# Patient Record
Sex: Male | Born: 1995 | Race: White | Hispanic: No | Marital: Married | Smoking: Never smoker
Health system: Southern US, Community
[De-identification: ages and names within clinical notes are randomized; demographics above are authoritative.]

---

## 2018-12-01 ENCOUNTER — Encounter (HOSPITAL_COMMUNITY): Payer: Self-pay

## 2018-12-01 ENCOUNTER — Other Ambulatory Visit: Payer: Self-pay

## 2018-12-01 ENCOUNTER — Other Ambulatory Visit: Payer: Self-pay | Admitting: Behavioral Health

## 2018-12-01 ENCOUNTER — Inpatient Hospital Stay (HOSPITAL_COMMUNITY)
Admission: AD | Admit: 2018-12-01 | Discharge: 2018-12-06 | DRG: 885 | Disposition: A | Discharge status: Home / Self Care | Payer: Federal, State, Local not specified - Other | Source: Other Acute Inpatient Hospital | Attending: Psychiatry | Admitting: Psychiatry

## 2018-12-01 DIAGNOSIS — Z590 Homelessness: Secondary | ICD-10-CM | POA: Diagnosis not present

## 2018-12-01 DIAGNOSIS — F331 Major depressive disorder, recurrent, moderate: Secondary | ICD-10-CM | POA: Diagnosis not present

## 2018-12-01 DIAGNOSIS — F0634 Mood disorder due to known physiological condition with mixed features: Secondary | ICD-10-CM | POA: Diagnosis not present

## 2018-12-01 DIAGNOSIS — F17210 Nicotine dependence, cigarettes, uncomplicated: Secondary | ICD-10-CM | POA: Diagnosis present

## 2018-12-01 DIAGNOSIS — Z915 Personal history of self-harm: Secondary | ICD-10-CM

## 2018-12-01 DIAGNOSIS — F332 Major depressive disorder, recurrent severe without psychotic features: Secondary | ICD-10-CM | POA: Diagnosis not present

## 2018-12-01 DIAGNOSIS — R52 Pain, unspecified: Secondary | ICD-10-CM

## 2018-12-01 DIAGNOSIS — R45851 Suicidal ideations: Secondary | ICD-10-CM | POA: Diagnosis present

## 2018-12-01 DIAGNOSIS — F3189 Other bipolar disorder: Secondary | ICD-10-CM | POA: Diagnosis not present

## 2018-12-01 DIAGNOSIS — F329 Major depressive disorder, single episode, unspecified: Secondary | ICD-10-CM | POA: Diagnosis present

## 2018-12-01 DIAGNOSIS — F3160 Bipolar disorder, current episode mixed, unspecified: Secondary | ICD-10-CM | POA: Diagnosis present

## 2018-12-01 DIAGNOSIS — G4700 Insomnia, unspecified: Secondary | ICD-10-CM | POA: Diagnosis present

## 2018-12-01 MED ORDER — LORAZEPAM 1 MG PO TABS
1.0000 mg | ORAL_TABLET | Freq: Four times a day (QID) | ORAL | Status: DC | PRN
  Administered 2018-12-04: 1 mg via ORAL
  Filled 2018-12-01: qty 1

## 2018-12-01 MED ORDER — OLANZAPINE 10 MG PO TBDP
10.0000 mg | ORAL_TABLET | Freq: Three times a day (TID) | ORAL | Status: DC | PRN
  Administered 2018-12-06: 10 mg via ORAL
  Filled 2018-12-01: qty 1

## 2018-12-01 MED ORDER — LORAZEPAM 1 MG PO TABS: 1.0000 mg | ORAL_TABLET | ORAL | Status: DC | PRN

## 2018-12-01 MED ORDER — ALUM & MAG HYDROXIDE-SIMETH 200-200-20 MG/5ML PO SUSP: 30.0000 mL | ORAL | Status: DC | PRN

## 2018-12-01 MED ORDER — MAGNESIUM HYDROXIDE 400 MG/5ML PO SUSP: 30.0000 mL | Freq: Every day | ORAL | Status: DC | PRN

## 2018-12-01 MED ORDER — ZIPRASIDONE MESYLATE 20 MG IM SOLR: 20.0000 mg | INTRAMUSCULAR | Status: DC | PRN

## 2018-12-01 NOTE — Tx Team (Signed)
Initial Treatment Plan 12/01/2018 4:46 PM Nicholas Nunez HKG:[id number]    PATIENT STRESSORS: Financial difficulties Marital or family conflict Substance abuse Traumatic event   PATIENT STRENGTHS: Communication skills Motivation for treatment/growth   PATIENT IDENTIFIED PROBLEMS: "suicidal thoughts"  "depression"  "steel plate in my jaw"                 DISCHARGE CRITERIA:  Ability to meet basic life and health needs Adequate post-discharge living arrangements Improved stabilization in mood, thinking, and/or behavior Medical problems require only outpatient monitoring Reduction of life-threatening or endangering symptoms to within safe limits  PRELIMINARY DISCHARGE PLAN: Outpatient therapy Placement in alternative living arrangements  PATIENT/FAMILY INVOLVEMENT: This treatment plan has been presented to and reviewed with the patient, Nicholas Nunez.  The patient and family have been given the opportunity to ask questions and make suggestions.  Baron Sane, RN 12/01/2018, 4:46 PM
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
## 2018-12-01 NOTE — BHH Group Notes (Signed)
Adult Psychoeducational Group Note  Date:  12/01/2018 Time:  9:42 PM  Group Topic/Focus:  Wrap-Up Group:   The focus of this group is to help patients review their daily goal of treatment and discuss progress on daily workbooks.  Participation Level:  Active  Participation Quality:  Appropriate and Attentive  Affect:  Appropriate  Cognitive:  Alert and Appropriate  Insight: Appropriate and Good  Engagement in Group:  Engaged  Modes of Intervention:  Discussion and Education  Additional Comments:  Pt attended and participated in wrap up group this evening and rated their day a 7/10. Pt did not achieve their goal, which was to "get better, but they only fell deeper".   NAFTALI CARCHI 12/01/2018, 9:42 PM

## 2018-12-01 NOTE — Progress Notes (Signed)
D: Pt presented very arrogant , irritable, confrontational and tried to be manipulative with Probation officer. Pt stated he was SI/ [facility]- but contracted for safety A: Pt was offered support and encouragement.  Pt was encourage to attend groups. Q 15 minute checks were done for safety.  R: safety maintained on unit.

## 2018-12-02 ENCOUNTER — Inpatient Hospital Stay (HOSPITAL_COMMUNITY)
Admission: AD | Admit: 2018-12-02 | Discharge: 2018-12-02 | Disposition: A | Discharge status: Home / Self Care | Payer: Federal, State, Local not specified - Other | Source: Other Acute Inpatient Hospital | Attending: Psychiatry | Admitting: Psychiatry

## 2018-12-02 DIAGNOSIS — F0634 Mood disorder due to known physiological condition with mixed features: Secondary | ICD-10-CM

## 2018-12-02 LAB — LIPID PANEL
Cholesterol: 162 mg/dL (ref 0–200)
HDL: 44 mg/dL (ref >40)
LDL Cholesterol: 111 mg/dL — ABNORMAL HIGH (ref 0–99)
Total CHOL/HDL Ratio: 3.7 RATIO
Triglycerides: 37 mg/dL (ref <150)
VLDL: 7 mg/dL (ref 0–40)

## 2018-12-02 LAB — HEMOGLOBIN A1C
Hgb A1c MFr Bld: 5.8 % — ABNORMAL HIGH (ref 4.8–5.6)
Mean Plasma Glucose: 119.76 mg/dL

## 2018-12-02 MED ORDER — FLUOXETINE HCL 20 MG/5ML PO SOLN
20.0000 mg | Freq: Every day | ORAL | Status: DC
  Administered 2018-12-02: 4 mg via ORAL
  Administered 2018-12-03 – 2018-12-06 (×4): 20 mg via ORAL
  Filled 2018-12-02 (×9): qty 5

## 2018-12-02 MED ORDER — NICOTINE POLACRILEX 2 MG MT GUM
2.0000 mg | CHEWING_GUM | OROMUCOSAL | Status: DC | PRN
  Administered 2018-12-02 – 2018-12-06 (×12): 2 mg via ORAL
  Filled 2018-12-02 (×2): qty 1
  Filled 2018-12-02 (×2): qty 10

## 2018-12-02 MED ORDER — TRAZODONE HCL 100 MG PO TABS
100.0000 mg | ORAL_TABLET | Freq: Every evening | ORAL | Status: DC | PRN
  Filled 2018-12-02: qty 1

## 2018-12-02 MED ORDER — CARBAMAZEPINE 100 MG PO CHEW
200.0000 mg | CHEWABLE_TABLET | Freq: Three times a day (TID) | ORAL | Status: DC
  Administered 2018-12-02 – 2018-12-03 (×3): 200 mg via ORAL
  Filled 2018-12-02 (×6): qty 2

## 2018-12-02 NOTE — Tx Team (Signed)
Interdisciplinary Treatment and Diagnostic Plan Update  12/02/2018 Time of Session: 9:40am Nicholas Nunez MRN: [id number]  Principal Diagnosis: <principal problem not specified>  Secondary Diagnoses: Active Problems:   MDD (major depressive disorder)   Current Medications:  Current Facility-Administered Medications  Medication Dose Route Frequency Provider Last Rate Last Dose  . alum & mag hydroxide-simeth (MAALOX/MYLANTA) 200-200-20 MG/5ML suspension 30 mL  30 mL Oral Q4H PRN Mordecai Maes, NP      . OLANZapine zydis (ZYPREXA) disintegrating tablet 10 mg  10 mg Oral Q8H PRN Cobos, Myer Peer, MD       And  . LORazepam (ATIVAN) tablet 1 mg  1 mg Oral PRN Cobos, Myer Peer, MD       And  . ziprasidone (GEODON) injection 20 mg  20 mg Intramuscular PRN Cobos, Myer Peer, MD      . LORazepam (ATIVAN) tablet 1 mg  1 mg Oral Q6H PRN Cobos, Myer Peer, MD      . magnesium hydroxide (MILK OF MAGNESIA) suspension 30 mL  30 mL Oral Daily PRN Mordecai Maes, NP      . nicotine polacrilex (NICORETTE) gum 2 mg  2 mg Oral PRN Cobos, Myer Peer, MD       PTA Medications: No medications prior to admission.    Patient Stressors: Financial difficulties Marital or family conflict Substance abuse Traumatic event  Patient Strengths: Agricultural engineer for treatment/growth  Treatment Modalities: Medication Management, Group therapy, Case management,  1 to 1 session with clinician, Psychoeducation, Recreational therapy.   Physician Treatment Plan for Primary Diagnosis: <principal problem not specified> Long Term Goal(s): Improvement in symptoms so as ready for discharge Improvement in symptoms so as ready for discharge   Short Term Goals: Ability to identify changes in lifestyle to reduce recurrence of condition will improve Ability to verbalize feelings will improve Ability to disclose and discuss suicidal ideas Ability to demonstrate self-control will improve Ability to  identify and develop effective coping behaviors will improve Ability to maintain clinical measurements within normal limits will improve Ability to identify changes in lifestyle to reduce recurrence of condition will improve Ability to identify triggers associated with substance abuse/mental health issues will improve  Medication Management: Evaluate patient's response, side effects, and tolerance of medication regimen.  Therapeutic Interventions: 1 to 1 sessions, Unit Group sessions and Medication administration.  Evaluation of Outcomes: Not Met  Physician Treatment Plan for Secondary Diagnosis: Active Problems:   MDD (major depressive disorder)  Long Term Goal(s): Improvement in symptoms so as ready for discharge Improvement in symptoms so as ready for discharge   Short Term Goals: Ability to identify changes in lifestyle to reduce recurrence of condition will improve Ability to verbalize feelings will improve Ability to disclose and discuss suicidal ideas Ability to demonstrate self-control will improve Ability to identify and develop effective coping behaviors will improve Ability to maintain clinical measurements within normal limits will improve Ability to identify changes in lifestyle to reduce recurrence of condition will improve Ability to identify triggers associated with substance abuse/mental health issues will improve     Medication Management: Evaluate patient's response, side effects, and tolerance of medication regimen.  Therapeutic Interventions: 1 to 1 sessions, Unit Group sessions and Medication administration.  Evaluation of Outcomes: Not Met   RN Treatment Plan for Primary Diagnosis: <principal problem not specified> Long Term Goal(s): Knowledge of disease and therapeutic regimen to maintain health will improve  Short Term Goals: Ability to participate in decision making will improve, Ability to  verbalize feelings will improve, Ability to disclose and discuss  suicidal ideas, Ability to identify and develop effective coping behaviors will improve and Compliance with prescribed medications will improve  Medication Management: RN will administer medications as ordered by provider, will assess and evaluate patient's response and provide education to patient for prescribed medication. RN will report any adverse and/or side effects to prescribing provider.  Therapeutic Interventions: 1 on 1 counseling sessions, Psychoeducation, Medication administration, Evaluate responses to treatment, Monitor vital signs and CBGs as ordered, Perform/monitor CIWA, COWS, AIMS and Fall Risk screenings as ordered, Perform wound care treatments as ordered.  Evaluation of Outcomes: Not Met   LCSW Treatment Plan for Primary Diagnosis: <principal problem not specified> Long Term Goal(s): Safe transition to appropriate next level of care at discharge, Engage patient in therapeutic group addressing interpersonal concerns.  Short Term Goals: Engage patient in aftercare planning with referrals and resources  Therapeutic Interventions: Assess for all discharge needs, 1 to 1 time with Social worker, Explore available resources and support systems, Assess for adequacy in community support network, Educate family and significant other(s) on suicide prevention, Complete Psychosocial Assessment, Interpersonal group therapy.  Evaluation of Outcomes: Not Met   Progress in Treatment: Attending groups: Yes. Participating in groups: Yes. Taking medication as prescribed: Yes. Toleration medication: Yes. Family/Significant other contact made: No, will contact:  if patient consents to collateral contacts Patient understands diagnosis: Yes. Discussing patient identified problems/goals with staff: Yes. Medical problems stabilized or resolved: Yes. Denies suicidal/homicidal ideation: Yes. Issues/concerns per patient self-inventory: No. Other:   New problem(s) identified:  None   New  Short Term/Long Term Goal(s): Detox, medication stabilization, elimination of SI thoughts, development of comprehensive mental wellness plan.    Patient Goals:  "Dont want to be so depressed"   Discharge Plan or Barriers: Patient recently admitted. CSW will continue to follow and assess for appropriate referrals and possible discharge planning.    Reason for Continuation of Hospitalization: Anxiety Depression Medication stabilization Suicidal ideation  Estimated Length of Stay:  Attendees: Patient: Nicholas Nunez 12/02/2018 10:27 AM  Physician: Dr. Myles Lipps, MD 12/02/2018 10:27 AM  Nursing: Selinda Eon.Jerilynn Mages, RN 12/02/2018 10:27 AM  RN Care Manager: 12/02/2018 10:27 AM  Social Worker: Radonna Ricker, [place] 12/02/2018 10:27 AM  Recreational Therapist:  12/02/2018 10:27 AM  Other:  12/02/2018 10:27 AM  Other:  12/02/2018 10:27 AM  Other: 12/02/2018 10:27 AM    Scribe for Treatment Team: Marylee Floras, [place] 12/02/2018 10:27 AM
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
## 2018-12-02 NOTE — Progress Notes (Signed)
D: Pt has remained in his room so far this evening.  Presents with depressed affect and mood.  Denies SI/HI and hallucinations.  Pt is interacting minimally with staff.  A: Met with pt 1:1.  Actively listened to pt and provided support and encouragement.  15 minute safety checks in place.  R: Pt is safe on the unit.  He reports he will inform staff of needs and concerns.  Will continue to monitor and assess.
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
## 2018-12-02 NOTE — Progress Notes (Signed)
Recreation Therapy Notes  Date:  9.4.20 Time: 0930 Location: [address] Dayroom  Group Topic: Stress Management  Goal Area(s) Addresses:  Patient will identify positive stress management techniques. Patient will identify benefits of using stress management post d/c.  Behavioral Response: Engaged  Intervention: Stress Management  Activity : Meditation.  LRT played a meditation that focused on the power of being resilient in the face of adversity.  Patients were to listen as meditation played to follow along.  Education:  Stress Management, Discharge Planning.   Education Outcome: Acknowledges Education  Clinical Observations/Feedback: Pt attended and participated in group.     Victorino Sparrow, LRT/CTRS        Victorino Sparrow A 12/02/2018 10:48 AM

## 2018-12-03 DIAGNOSIS — F331 Major depressive disorder, recurrent, moderate: Secondary | ICD-10-CM

## 2018-12-03 MED ORDER — RISPERIDONE 1 MG PO TABS
1.0000 mg | ORAL_TABLET | Freq: Two times a day (BID) | ORAL | Status: DC
  Administered 2018-12-03 – 2018-12-06 (×7): 1 mg via ORAL
  Filled 2018-12-03 (×6): qty 1
  Filled 2018-12-03: qty 14
  Filled 2018-12-03: qty 1
  Filled 2018-12-03 (×2): qty 14
  Filled 2018-12-03: qty 1
  Filled 2018-12-03: qty 14
  Filled 2018-12-03: qty 1

## 2018-12-03 MED ORDER — CARBAMAZEPINE 100 MG PO CHEW
300.0000 mg | CHEWABLE_TABLET | Freq: Three times a day (TID) | ORAL | Status: DC
  Administered 2018-12-03 – 2018-12-04 (×4): 300 mg via ORAL
  Filled 2018-12-03 (×11): qty 3

## 2018-12-03 NOTE — Progress Notes (Signed)
Patient ID: Nicholas Nunez, male   DOB: 02-25-1996, 23 y.o.   MRN: [id number]  [place] NOVEL CORONAVIRUS (COVID-19) DAILY CHECK-OFF SYMPTOMS - answer yes or no to each - every day NO YES  Have you had a fever in the past 24 hours?  . Fever (Temp > 37.80C / 100F) X   Have you had any of these symptoms in the past 24 hours? . New Cough .  Sore Throat  .  Shortness of Breath .  Difficulty Breathing .  Unexplained Body Aches   X   Have you had any one of these symptoms in the past 24 hours not related to allergies?   . Runny Nose .  Nasal Congestion .  Sneezing   X   If you have had runny nose, nasal congestion, sneezing in the past 24 hours, has it worsened?  X   EXPOSURES - check yes or no X   Have you traveled outside the state in the past 14 days?  X   Have you been in contact with someone with a confirmed diagnosis of COVID-19 or PUI in the past 14 days without wearing appropriate PPE?  X   Have you been living in the same home as a person with confirmed diagnosis of COVID-19 or a PUI (household contact)?    X   Have you been diagnosed with COVID-19?    X              What to do next: Answered NO to all: Answered YES to anything:   Proceed with unit schedule Follow the [facility] Inpatient Flowsheet.

## 2018-12-03 NOTE — Progress Notes (Signed)
Psychoeducational Group Note  Date:  12/03/2018 Time:  2130 Group Topic/Focus:  wrap up group  Participation Level: Did Not Attend  Participation Quality:  Not Applicable  Affect:  Not Applicable  Cognitive:  Not Applicable  Insight:  Not Applicable  Engagement in Group: Not Applicable  Additional Comments:  Pt remained in bed during group time.   Winfield Rast S 12/03/2018, 10:59 PM
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
## 2018-12-03 NOTE — Plan of Care (Signed)
  Problem: Education: Goal: Verbalization of understanding the information provided will improve Outcome: Progressing   Problem: Coping: Goal: Ability to demonstrate self-control will improve Outcome: Progressing   Problem: Safety: Goal: Periods of time without injury will increase Outcome: Progressing   

## 2018-12-03 NOTE — [facility] Suicide Risk Assessment (Signed)
[facility] INPATIENT:  Family/Significant Other Suicide Prevention Education  Suicide Prevention Education:  Patient Refusal for Family/Significant Other Suicide Prevention Education: The patient Nicholas Nunez has refused to provide written consent for family/significant other to be provided Family/Significant Other Suicide Prevention Education during admission and/or prior to discharge.  Physician notified.  SPE completed with pt, as pt refused to consent to family contact. SPI pamphlet provided to pt and pt was encouraged to share information with support network, ask questions, and talk about any concerns relating to SPE. Pt denies access to guns/firearms and verbalized understanding of information provided. Mobile Crisis information also provided to pt.    Avelina Laine LCSW 12/03/2018, 12:46 PM
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
## 2018-12-04 LAB — HEPATIC FUNCTION PANEL
ALT: 14 U/L (ref 0–44)
AST: 16 U/L (ref 15–41)
Albumin: 4.9 g/dL (ref 3.5–5.0)
Alkaline Phosphatase: 70 U/L (ref 38–126)
Bilirubin, Direct: 0.1 mg/dL (ref 0.0–0.2)
Indirect Bilirubin: 0.3 mg/dL (ref 0.3–0.9)
Total Bilirubin: 0.4 mg/dL (ref 0.3–1.2)
Total Protein: 8.6 g/dL — ABNORMAL HIGH (ref 6.5–8.1)

## 2018-12-04 LAB — CBC WITH DIFFERENTIAL/PLATELET
Abs Immature Granulocytes: 0.02 10*3/uL (ref 0.00–0.07)
Basophils Absolute: 0 10*3/uL (ref 0.0–0.1)
Basophils Relative: 1 %
Eosinophils Absolute: 0.2 10*3/uL (ref 0.0–0.5)
Eosinophils Relative: 2 %
HCT: 41.8 % (ref 39.0–52.0)
Hemoglobin: 13.6 g/dL (ref 13.0–17.0)
Immature Granulocytes: 0 %
Lymphocytes Relative: 28 %
Lymphs Abs: 2.3 10*3/uL (ref 0.7–4.0)
MCH: 30.7 pg (ref 26.0–34.0)
MCHC: 32.5 g/dL (ref 30.0–36.0)
MCV: 94.4 fL (ref 80.0–100.0)
Monocytes Absolute: 0.5 10*3/uL (ref 0.1–1.0)
Monocytes Relative: 6 %
Neutro Abs: 5.2 10*3/uL (ref 1.7–7.7)
Neutrophils Relative %: 63 %
Platelets: 195 10*3/uL (ref 150–400)
RBC: 4.43 MIL/uL (ref 4.22–5.81)
RDW: 12.6 % (ref 11.5–15.5)
WBC: 8.2 10*3/uL (ref 4.0–10.5)
nRBC: 0 % (ref 0.0–0.2)

## 2018-12-04 MED ORDER — CARBAMAZEPINE 100 MG PO CHEW
300.0000 mg | CHEWABLE_TABLET | Freq: Two times a day (BID) | ORAL | Status: DC
  Filled 2018-12-04: qty 3

## 2018-12-04 NOTE — Progress Notes (Signed)
Adult Psychoeducational Group Note  Date:  12/04/2018 Time:  9:18 AM  Group Topic/Focus:  Goals Group:   The focus of this group is to help patients establish daily goals to achieve during treatment and discuss how the patient can incorporate goal setting into their daily lives to aide in recovery.  Participation Level:  Active  Participation Quality:  Appropriate, Attentive and Sharing  Affect:  Appropriate  Cognitive:  Alert and Appropriate  Insight: Improving  Engagement in Group:  Engaged  Modes of Intervention:  Discussion, Exploration and Problem-solving  Additional Comments:  Nicholas Nunez's treatment goal is to focus on rebuilding his support system by exchanging  Contact information with a fellow peer. [name] R Sherylann Vangorden 12/04/2018, 9:18 AM

## 2018-12-04 NOTE — [facility] Group Notes (Signed)
[facility] LCSW Group Therapy Note   Date and Time: 12/04/2018 @ 10am  Type of Therapy and Topic:  Group Therapy:  Trust and Honesty    Participation Level:  [facility] PARTICIPATION LEVEL: Active   Mood: Pleasant    Description of Group:     In this group patients will be asked to explore the value of being honest.  Patients will be guided to discuss their thoughts, feelings, and behaviors related to honesty and trusting in others. Patients will process together how trust and honesty relate to forming relationships with peers, family members, and self. Each patient will be challenged to identify and express feelings of being vulnerable. Patients will discuss reasons why people are dishonest and identify alternative outcomes if one was truthful (to self or others). This group will be process-oriented, with patients participating in exploration of their own experiences, giving and receiving support, and processing challenge from other group members.     Therapeutic Goals:  1.  Patient will identify why honesty is important to relationships and how honesty overall affects relationships.  2.  Patient will identify a situation where they lied or were lied too and the  feelings, thought process, and behaviors surrounding the situation  3.  Patient will identify the meaning of being vulnerable, how that feels, and how that correlates to being honest with self and others.  4.  Patient will identify situations where they could have told the truth, but instead lied and explain reasons of dishonesty.     Summary of Patient Progress:      Patient was active and engaged throughout group therapy. Patient was able to identify why honesty is important to relationships. Patient stated that loyalty is why honesty is so important. Patient stated that he feels hurt when he is lied to but was able to identify reasons and his thought process around why he will lie to people. Patient discussed a situation where he  was currently trying to be honest with the doctor and he felt he was not hurt. Patient stated that he feels good when he tells the truth if people listen.        Therapeutic Modalities:    Cognitive Behavioral Therapy  Solution Focused Therapy  Motivational Interviewing  Brief Therapy   Ardelle Anton, MSW, LCSW

## 2018-12-04 NOTE — Progress Notes (Signed)
Writer observed patient up in the dayroom watching tv. Writer spoke with him 1:1 and he reports that the medicine he has taken today that he chews has him feeling like he has a buzz. He reports feeling like he's moving in slow motion. He reports that he does not take trazodone for sleep aid and he will be fine not taking it. Support given and safety maintained on unit with 15 min checks.

## 2018-12-04 NOTE — Progress Notes (Signed)
D.  Pt up and visible within milieu, appears guarded on approach.  It had been reported that patient was experiencing some jaw pain but this was denied when Pt asked.  Pt denies SI/HI/AVH at this time.  He was positive for evening wrap up group.  Pt states that he does not want to take ordered Trazodone for sleep because he had overdosed on it.   A.  Support and encouragement offered, medication given as ordered  R.  Pt remains safe on the unit, will continue to monitor.

## 2018-12-04 NOTE — Progress Notes (Signed)
[facility] Group Notes:  (Nursing/MHT/Case Management/Adjunct)  Date:  12/04/2018  Time:  2030  Type of Therapy:  wrap up group  Participation Level:  Active  Participation Quality:  Appropriate, Attentive, Sharing and Supportive  Affect:  Appropriate  Cognitive:  Lacking  Insight:  Improving  Engagement in Group:  Engaged  Modes of Intervention:  Clarification, Education and Support  Summary of Progress/Problems:  [facility] 12/04/2018, 9:40 PM
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
## 2018-12-04 NOTE — Progress Notes (Addendum)
[facility] MD Progress Note  12/04/2018 2:22 PM [facility]  MRN:  [id number] Subjective:  "These medications have me messed up."  Patient is a 23 year old male with an unspecified past psychiatric history who [facility] to [facility] on 11/30/2018 with suicidal ideation. The patient apparently contacted his significant other to contact police, and then was transported to the [facility]. He was transferred to our facility for evaluation and stabilization.  [facility] found sitting in the dayroom. He is less irritable and loud today. He is less focused on problems with his jaw. Today he is focused on problems with his medication- "these medications are worse than my jaw." He reports that "the chewable pill" is making his ears ring, throwing his legs off balance and making it difficult to think. Patient observed ambulating in the hallway without difficulty. He is still bizarre at times and reports "This medication you gave me is making me see bugs everywhere." He continues to ask about resources for follow-up for his jaw and is more open to hearing options at this time. He has been participating in groups. No agitated or disruptive behaviors on the unit. He denies SI/HI.  Principal Problem: <principal problem not specified> Diagnosis: Active Problems:   MDD (major depressive disorder)  Total Time spent with patient: 15 minutes  Past Psychiatric History: See admission H&P  Past Medical History: History reviewed. No pertinent past medical history. History reviewed. No pertinent surgical history. Family History: History reviewed. No pertinent family history. Family Psychiatric  History: See admission H&P Social History:  Social History   Substance and Sexual Activity  Alcohol Use Yes   Comment: occ     Social History   Substance and Sexual Activity  Drug Use Yes  . Types: Marijuana    Social History   Socioeconomic History  . Marital status: Married     Spouse name: Not on file  . Number of children: Not on file  . Years of education: Not on file  . Highest education level: Not on file  Occupational History  . Not on file  Social Needs  . Financial resource strain: Not on file  . Food insecurity    Worry: Not on file    Inability: Not on file  . Transportation needs    Medical: Not on file    Non-medical: Not on file  Tobacco Use  . Smoking status: Never Smoker  . Smokeless tobacco: Never Used  Substance and Sexual Activity  . Alcohol use: Yes    Comment: occ  . Drug use: Yes    Types: Marijuana  . Sexual activity: Not Currently  Lifestyle  . Physical activity    Days per week: Not on file    Minutes per session: Not on file  . Stress: Not on file  Relationships  . Social Musician on phone: Not on file    Gets together: Not on file    Attends religious service: Not on file    Active member of club or organization: Not on file    Attends meetings of clubs or organizations: Not on file    Relationship status: Not on file  Other Topics Concern  . Not on file  Social History Narrative  . Not on file   Additional Social History:    Pain Medications: please see mar Prescriptions: please see mar Over the Counter: please see mar History of alcohol / drug use?: No history of alcohol / drug abuse  Sleep: Good  Appetite:  Good  Current Medications: Current Facility-Administered Medications  Medication Dose Route Frequency Provider Last Rate Last Dose  . alum & mag hydroxide-simeth (MAALOX/MYLANTA) 200-200-20 MG/5ML suspension 30 mL  30 mL Oral Q4H PRN Denzil Magnusonhomas, Lashunda, NP      . Melene Muller[START ON 12/05/2018] carbamazepine (TEGRETOL) chewable tablet 300 mg  300 mg Oral BID Aldean BakerSykes, Janet E, NP      . FLUoxetine (PROZAC) 20 MG/5ML solution 20 mg  20 mg Oral Daily Antonieta Pertlary, Greg Lawson, MD   20 mg at 12/04/18 0757  . OLANZapine zydis (ZYPREXA) disintegrating tablet 10 mg  10 mg Oral Q8H PRN Cobos,  Rockey SituFernando A, MD       And  . LORazepam (ATIVAN) tablet 1 mg  1 mg Oral PRN Cobos, Rockey SituFernando A, MD       And  . ziprasidone (GEODON) injection 20 mg  20 mg Intramuscular PRN Cobos, Rockey SituFernando A, MD      . LORazepam (ATIVAN) tablet 1 mg  1 mg Oral Q6H PRN Cobos, Rockey SituFernando A, MD      . magnesium hydroxide (MILK OF MAGNESIA) suspension 30 mL  30 mL Oral Daily PRN Denzil Magnusonhomas, Lashunda, NP      . nicotine polacrilex (NICORETTE) gum 2 mg  2 mg Oral PRN Cobos, Rockey SituFernando A, MD   2 mg at 12/04/18 1204  . risperiDONE (RISPERDAL) tablet 1 mg  1 mg Oral BID Aldean BakerSykes, Janet E, NP   1 mg at 12/04/18 0758  . traZODone (DESYREL) tablet 100 mg  100 mg Oral QHS PRN Antonieta Pertlary, Greg Lawson, MD        Lab Results: No results found for this or any previous visit (from the past 48 hour(s)).  Blood Alcohol level:  [facility] found for: [facility]  Metabolic Disorder Labs: Lab Results  Component Value Date   HGBA1C 5.8 (H) 12/02/2018   MPG 119.76 12/02/2018   No results found for: PROLACTIN Lab Results  Component Value Date   CHOL 162 12/02/2018   TRIG 37 12/02/2018   HDL 44 12/02/2018   CHOLHDL 3.7 12/02/2018   VLDL 7 12/02/2018   LDLCALC 111 (H) 12/02/2018    Physical Findings: AIMS: Facial and Oral Movements Muscles of Facial Expression: None, normal Lips and Perioral Area: None, normal Jaw: None, normal Tongue: None, normal,Extremity Movements Upper (arms, wrists, hands, fingers): None, normal Lower (legs, knees, ankles, toes): None, normal, Trunk Movements Neck, shoulders, hips: None, normal, Overall Severity Severity of abnormal movements (highest score from questions above): None, normal Incapacitation due to abnormal movements: None, normal Patient's awareness of abnormal movements (rate only patient's report): No Awareness, Dental Status Current problems with teeth and/or dentures?: No Does patient usually wear dentures?: No  CIWA:    COWS:     Musculoskeletal: Strength & Muscle Tone: within normal  limits Gait & Station: normal Patient leans: N/A  Psychiatric Specialty Exam: Physical Exam  Nursing note and vitals reviewed. Constitutional: He is oriented to person, place, and time. He appears well-developed and well-nourished.  Cardiovascular: Normal rate.  Respiratory: Effort normal.  Neurological: He is alert and oriented to person, place, and time.    Review of Systems  Constitutional: Negative.   Respiratory: Negative for cough and shortness of breath.   Cardiovascular: Negative for chest pain.  Psychiatric/Behavioral: Positive for depression and substance abuse (THC). Negative for suicidal ideas. The patient is not nervous/anxious and does not have insomnia.     Blood pressure 133/87, pulse 71, temperature 97.6  F (36.4 C), temperature source Oral, resp. rate 16, height 6' (1.829 m), weight 67.1 kg, SpO2 99 %.Body mass index is 20.07 kg/m.  General Appearance: Casual  Eye Contact:  Good  Speech:  Normal Rate  Volume:  Normal  Mood:  Anxious  Affect:  Congruent  Thought Process:  Coherent  Orientation:  Full (Time, Place, and Person)  Thought Content:  Illogical and Tangential  Suicidal Thoughts:  No  Homicidal Thoughts:  No  Memory:  Immediate;   Fair Recent;   Fair  Judgement:  Intact  Insight:  Fair  Psychomotor Activity:  Normal  Concentration:  Concentration: Fair and Attention Span: Fair  Recall:  [employer] of Knowledge:  Fair  Language:  Fair  Akathisia:  No  Handed:  Right  AIMS (if indicated):     Assets:  Communication Skills Desire for Improvement Leisure Time Resilience  ADL's:  Intact  Cognition:  WNL  Sleep:  Number of Hours: 5.75     Treatment Plan Summary: Daily contact with patient to assess and evaluate symptoms and progress in treatment and Medication management   Continue inpatient hospitalization.  Tegretol level, CBC diff, LFTs  Decrease Tegretol to 300 mg PO BID for mood instability Continue Risperdal 1 mg PO BID for  psychosis/mood instability Continue Prozac 20 mg PO daily for mood Continue Ativan 1 mg PO Q6HR PRN anxiety Continue trazodone 100 mg PO QHS PRN insomnia Continue agitation protocol PRN agitation  Patient will participate in the therapeutic group milieu.  Discharge disposition in progress.   Connye Burkitt, NP 12/04/2018, 2:22 PM   Per his complaints tegretol stopped. Plan discharge tomorrow. Mandibular x-rays revealed no major abnormalities. No soft tissue swelling. Patient will need to follow up with outpatient ENT or oral surgeon.

## 2018-12-05 DIAGNOSIS — F332 Major depressive disorder, recurrent severe without psychotic features: Secondary | ICD-10-CM

## 2018-12-05 LAB — BASIC METABOLIC PANEL
Anion gap: 6 (ref 5–15)
BUN: 16 mg/dL (ref 6–20)
CO2: 29 mmol/L (ref 22–32)
Calcium: 9.6 mg/dL (ref 8.9–10.3)
Chloride: 101 mmol/L (ref 98–111)
Creatinine, Ser: 1.07 mg/dL (ref 0.61–1.24)
GFR calc Af Amer: >60 mL/min (ref >60)
GFR calc non Af Amer: >60 mL/min (ref >60)
Glucose, Bld: 128 mg/dL — ABNORMAL HIGH (ref 70–99)
Potassium: 3.5 mmol/L (ref 3.5–5.1)
Sodium: 136 mmol/L (ref 135–145)

## 2018-12-05 LAB — CARBAMAZEPINE LEVEL, TOTAL
Carbamazepine Lvl: 20 ug/mL (ref 4.0–12.0)
Carbamazepine Lvl: 12.9 ug/mL — ABNORMAL HIGH (ref 4.0–12.0)

## 2018-12-05 MED ORDER — TOPIRAMATE 25 MG PO TABS
50.0000 mg | ORAL_TABLET | Freq: Three times a day (TID) | ORAL | Status: DC
  Administered 2018-12-05 – 2018-12-06 (×4): 50 mg via ORAL
  Filled 2018-12-05: qty 42
  Filled 2018-12-05 (×3): qty 2
  Filled 2018-12-05 (×4): qty 42
  Filled 2018-12-05: qty 2
  Filled 2018-12-05: qty 42
  Filled 2018-12-05 (×3): qty 2

## 2018-12-05 MED ORDER — KETOROLAC TROMETHAMINE 60 MG/2ML IM SOLN
60.0000 mg | Freq: Once | INTRAMUSCULAR | Status: AC
  Administered 2018-12-05: 60 mg via INTRAMUSCULAR
  Filled 2018-12-05 (×2): qty 2

## 2018-12-05 NOTE — Progress Notes (Signed)
D: Pt passive SI/AVH- contracts for safety. Pt is pleasant and cooperative. Pt stated he did not need any sleep medication. Pt visible on the milieu this evening A: Pt was offered support and encouragement. Pt was encourage to attend groups. Q 15 minute checks were done for safety.  R: safety maintained on unit.

## 2018-12-05 NOTE — Plan of Care (Signed)
Nurse discussed anxiety, depression and coping skills with patient.  

## 2018-12-05 NOTE — Progress Notes (Signed)
Adult Psychoeducational Group Note  Date:  12/05/2018 Time:  10:44 PM  Group Topic/Focus:  Wrap-Up Group:   The focus of this group is to help patients review their daily goal of treatment and discuss progress on daily workbooks.  Participation Level:  Active  Participation Quality:  Appropriate  Affect:  Appropriate  Cognitive:  Appropriate  Insight: Appropriate  Engagement in Group:  Engaged  Modes of Intervention:  Discussion  Additional Comments:  Patient attended group and said that his day was a 64. His goal for today was not to pass out and he did not.   Kavi Almquist W Tiah Heckel 09/01/352, 10:44 PM

## 2018-12-05 NOTE — Progress Notes (Signed)
[facility]  12/05/2018 11:27 AM [facility] Connors[facility]  MRN:  [id number] Subjective:  Patient seen for 2 interviews the first 1 he was fairly cordial, I broke that went off after some time because I needed to review his records further as it was clear that I needed to review his case further in order to make med adjustments.  In the second interview he is little more irritable but agree that his psychiatric difficulties are propelled by chronic pain issues and his frustration at his situation is unclear the extent of his maxillofacial injuries.  At any rate he does agree to a trial of topiramate which he does not believe he is ever had for chronic pain issues Principal Problem: Depression/anger/chronic pain/probable issues of secondary gain may be homeless but will not acknowledge and address Diagnosis: Active Problems:   MDD (major depressive disorder)  Total Time spent with patient: 20 minutes  Past Psychiatric History: History of depression  Past Medical History: History reviewed. No pertinent past medical history. History reviewed. No pertinent surgical history. Family History: History reviewed. No pertinent family history. Family Psychiatric  History: See eval Social History:  Social History   Substance and Sexual Activity  Alcohol Use Yes   Comment: occ     Social History   Substance and Sexual Activity  Drug Use Yes  . Types: Marijuana    Social History   Socioeconomic History  . Marital status: Married    Spouse name: Not on file  . Number of children: Not on file  . Years of education: Not on file  . Highest education level: Not on file  Occupational History  . Not on file  Social Needs  . Financial resource strain: Not on file  . Food insecurity    Worry: Not on file    Inability: Not on file  . Transportation needs    Medical: Not on file    Non-medical: Not on file  Tobacco Use  . Smoking status: Never Smoker  . Smokeless tobacco: Never Used  Substance  and Sexual Activity  . Alcohol use: Yes    Comment: occ  . Drug use: Yes    Types: Marijuana  . Sexual activity: Not Currently  Lifestyle  . Physical activity    Days per week: Not on file    Minutes per session: Not on file  . Stress: Not on file  Relationships  . Social Musicianconnections    Talks on phone: Not on file    Gets together: Not on file    Attends religious service: Not on file    Active member of club or organization: Not on file    Attends meetings of clubs or organizations: Not on file    Relationship status: Not on file  Other Topics Concern  . Not on file  Social History Narrative  . Not on file   Additional Social History:    Pain Medications: please see mar Prescriptions: please see mar Over the Counter: please see mar History of alcohol / drug use?: No history of alcohol / drug abuse                    Sleep: Good  Appetite:  Poor  Current Medications: Current Facility-Administered Medications  Medication Dose Route Frequency Provider Last Rate Last Dose  . alum & mag hydroxide-simeth (MAALOX/MYLANTA) 200-200-20 MG/5ML suspension 30 mL  30 mL Oral Q4H PRN Denzil Magnusonhomas, Lashunda, NP      . FLUoxetine (PROZAC)  20 MG/5ML solution 20 mg  20 mg Oral Daily Antonieta Pert, MD   20 mg at 12/05/18 0809  . ketorolac (TORADOL) injection 60 mg  60 mg Intramuscular Once Malvin Johns, MD      . magnesium hydroxide (MILK OF MAGNESIA) suspension 30 mL  30 mL Oral Daily PRN Denzil Magnuson, NP      . nicotine polacrilex (NICORETTE) gum 2 mg  2 mg Oral PRN Cobos, Rockey Situ, MD   2 mg at 12/05/18 0841  . OLANZapine zydis (ZYPREXA) disintegrating tablet 10 mg  10 mg Oral Q8H PRN Cobos, Rockey Situ, MD       And  . ziprasidone (GEODON) injection 20 mg  20 mg Intramuscular PRN Cobos, Fernando A, MD      . risperiDONE (RISPERDAL) tablet 1 mg  1 mg Oral BID Aldean Baker, NP   1 mg at 12/05/18 0809  . topiramate (TOPAMAX) tablet 50 mg  50 mg Oral TID Malvin Johns, MD       . traZODone (DESYREL) tablet 100 mg  100 mg Oral QHS PRN Antonieta Pert, MD        Lab Results:  Results for orders placed or performed during the hospital encounter of 12/01/18 (from the past 48 hour(s))  Carbamazepine level, total     Status: Abnormal   Collection Time: 12/04/18  5:53 PM  Result Value Ref Range   Carbamazepine Lvl 20.0 (HH) 4.0 - 12.0 ug/mL    Comment: RESULTS CONFIRMED BY MANUAL DILUTION CRITICAL RESULT CALLED TO, READ BACK BY AND VERIFIED WITH: BOWMAN,L RN 12/05/2018 0525 JORDANS Performed at [facility], [address], [place], [place] [address]   Hepatic function panel     Status: Abnormal   Collection Time: 12/04/18  5:53 PM  Result Value Ref Range   Total Protein 8.6 (H) 6.5 - 8.1 g/dL   Albumin 4.9 3.5 - 5.0 g/dL   AST 16 15 - 41 U/L   ALT 14 0 - 44 U/L   Alkaline Phosphatase 70 38 - 126 U/L   Total Bilirubin 0.4 0.3 - 1.2 mg/dL   Bilirubin, Direct 0.1 0.0 - 0.2 mg/dL   Indirect Bilirubin 0.3 0.3 - 0.9 mg/dL    Comment: Performed at [facility], [address] [place], [place] [address]  CBC with Differential/Platelet     Status: None   Collection Time: 12/04/18  5:53 PM  Result Value Ref Range   WBC 8.2 4.0 - 10.5 K/uL   RBC 4.43 4.22 - 5.81 MIL/uL   Hemoglobin 13.6 13.0 - 17.0 g/dL   HCT 82.9 56.2 - 13.0 %   MCV 94.4 80.0 - 100.0 fL   MCH 30.7 26.0 - 34.0 pg   MCHC 32.5 30.0 - 36.0 g/dL   RDW 86.5 78.4 - 69.6 %   Platelets 195 150 - 400 K/uL   nRBC 0.0 0.0 - 0.2 %   Neutrophils Relative % 63 %   Neutro Abs 5.2 1.7 - 7.7 K/uL   Lymphocytes Relative 28 %   Lymphs Abs 2.3 0.7 - 4.0 K/uL   Monocytes Relative 6 %   Monocytes Absolute 0.5 0.1 - 1.0 K/uL   Eosinophils Relative 2 %   Eosinophils Absolute 0.2 0.0 - 0.5 K/uL   Basophils Relative 1 %   Basophils Absolute 0.0 0.0 - 0.1 K/uL   Immature Granulocytes 0 %   Abs Immature Granulocytes 0.02 0.00 - 0.07 K/uL    Comment:  Performed at [facility], [place] [address], [place], [place] [address]  Carbamazepine level, total     Status: Abnormal   Collection Time: 12/05/18  6:38 AM  Result Value Ref Range   Carbamazepine Lvl 12.9 (H) 4.0 - 12.0 ug/mL    Comment: Performed at [facility] [address] [place], [place] [address]  Basic metabolic panel     Status: Abnormal   Collection Time: 12/05/18  6:38 AM  Result Value Ref Range   Sodium 136 135 - 145 mmol/L   Potassium 3.5 3.5 - 5.1 mmol/L   Chloride 101 98 - 111 mmol/L   CO2 29 22 - 32 mmol/L   Glucose, Bld 128 (H) 70 - 99 mg/dL   BUN 16 6 - 20 mg/dL   Creatinine, Ser 1.07 0.61 - 1.24 mg/dL   Calcium 9.6 8.9 - 10.3 mg/dL   GFR calc non Af Amer >60 >60 mL/min   GFR calc Af Amer >60 >60 mL/min   Anion gap 6 5 - 15    Comment: Performed at [facility], [place] [address], [place], [place] [address]    Blood Alcohol level:  No results found for: [facility]  Metabolic Disorder Labs: Lab Results  Component Value Date   HGBA1C 5.8 (H) 12/02/2018   MPG 119.76 12/02/2018   No results found for: PROLACTIN Lab Results  Component Value Date   CHOL 162 12/02/2018   TRIG 37 12/02/2018   HDL 44 12/02/2018   CHOLHDL 3.7 12/02/2018   VLDL 7 12/02/2018   LDLCALC 111 (H) 12/02/2018    Physical Findings: AIMS: Facial and Oral Movements Muscles of Facial Expression: None, normal Lips and Perioral Area: None, normal Jaw: None, normal Tongue: None, normal,Extremity Movements Upper (arms, wrists, hands, fingers): None, normal Lower (legs, knees, ankles, toes): None, normal, Trunk Movements Neck, shoulders, hips: None, normal, Overall Severity Severity of abnormal movements (highest score from questions above): None, normal Incapacitation due to abnormal movements: None, normal Patient's awareness of abnormal movements (rate only patient's report): No Awareness, Dental Status Current problems with teeth and/or dentures?: No Does patient usually wear  dentures?: No  CIWA:    COWS:     Musculoskeletal: Strength & Muscle Tone: within normal limits Gait & Station: normal Patient leans: N/A  Psychiatric Specialty Exam: Physical Exam  ROS  Blood pressure 125/78, pulse 72, temperature 98 F (36.7 C), temperature source Oral, resp. rate 16, height 6' (1.829 m), weight 67.1 kg, SpO2 97 %.Body mass index is 20.07 kg/m.  General Appearance: Casual  Eye Contact:  Fair  Speech:  Clear and Coherent  Volume:  Decreased  Mood:  Dysphoric and Irritable  Affect:  Appropriate and Congruent  Thought Process:  Goal Directed and Descriptions of Associations: Tangential  Orientation:  Full (Time, Place, and Person)  Thought Content:  Logical  Suicidal Thoughts:  No  Homicidal Thoughts:  No  Memory:  Immediate;   Fair  Judgement:  Fair  Insight:  Fair  Psychomotor Activity:  Normal  Concentration:  Concentration: Fair  Recall:  [employer] of Knowledge:  Fair  Language:  Fair  Akathisia:  Negative  Handed:  Right  AIMS (if indicated):     Assets:  Communication Skills Leisure Time Resilience  ADL's:  Intact  Cognition:  WNL  Sleep:  Number of Hours: 6.25     Treatment Plan Summary: Daily contact with patient to assess and evaluate symptoms and progress in treatment and  Medication management continue current antidepressant therapy risperidone therapy but also add topiramate for chronic pain measures add Toradol 1 time try continue 50-minute precautions prepared for discharge tomorrow  Malvin JohnsFARAH,Chantrell Apsey, MD 12/05/2018, 11:27 AM

## 2018-12-05 NOTE — Progress Notes (Signed)
D:  Patient's self inventory sheet, patient sleeps good, sleep medication helpful.  Good appetite, normal energy level, good concentration.  Rated depression 8, hopeless 7, anxiety 9.  Withdrawals, cravings, agitation.  Denied SI.  Denied physical problems.  Denied physical pain.  Goal is keep control.  Plans to stay focused.  Does have discharge plans. A:  Medications administered per MD orders.  Emotional support and encouragement given patient.. R:  Denied SI and HI, contracts for safety.  Denied A/V hallucinations.  Safety maintained with 15 minute checks.

## 2018-12-05 NOTE — Progress Notes (Signed)
Recreation Therapy Notes  Date:  9.7.20 Time: 0930 Location: [address] Dayroom  Group Topic: Stress Management  Goal Area(s) Addresses:  Patient will identify positive stress management techniques. Patient will identify benefits of using stress management post d/c.  Behavioral Response: Engaged  Intervention:  Stress Management  Activity :  Meditation.  LRT introduced the stress management technique of meditation.  LRT played a meditation that focused on making the most of your day and possibilities the day holds.  Education:  Stress Management, Discharge Planning.   Education Outcome: Acknowledges Education  Clinical Observations/Feedback:  Pt attended and participated in group.     Victorino Sparrow, LRT/CTRS         Victorino Sparrow A 12/05/2018 10:44 AM

## 2018-12-06 DIAGNOSIS — F0634 Mood disorder due to known physiological condition with mixed features: Secondary | ICD-10-CM

## 2018-12-06 MED ORDER — RISPERIDONE 1 MG PO TABS: 1.0000 mg | ORAL_TABLET | Freq: Two times a day (BID) | ORAL | 0 refills | Status: AC

## 2018-12-06 MED ORDER — TOPIRAMATE 50 MG PO TABS: 50.0000 mg | ORAL_TABLET | Freq: Three times a day (TID) | ORAL | 0 refills | Status: AC

## 2018-12-06 MED ORDER — FLUOXETINE HCL 20 MG/5ML PO SOLN: 20.0000 mg | Freq: Every day | ORAL | 0 refills | Status: AC

## 2018-12-06 MED ORDER — NICOTINE POLACRILEX 2 MG MT GUM: 2.0000 mg | CHEWING_GUM | OROMUCOSAL | 0 refills | Status: AC | PRN

## 2018-12-06 NOTE — Progress Notes (Signed)
  [facility] Adult Case Management Discharge Plan :  Will you be returning to the same living situation after discharge:  Yes,  returning to [place], provided shelter resources. At discharge, do you have transportation home?: Yes,  taking Lyft/[employer]. Do you have the ability to pay for your medications: No. Provided samples, declines follow up.  Release of information consent forms completed and in the chart.  Patient to Follow up at: Follow-up Information    Patient declines Follow up.   Contact information: Patient declines all outpatient follow up.          Next level of care provider has access to [place] and Suicide Prevention discussed: Yes,  with patient.   Has patient been referred to the Quitline?: Patient refused referral  Patient has been referred for addiction treatment: Pt. refused referral  Joellen Jersey, [place] 12/06/2018, 9:22 AM

## 2018-12-06 NOTE — [facility] Suicide Risk Assessment (Signed)
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  All other systems reviewed and are negative.   Blood pressure 110/74, pulse 68, temperature (!) 97.5 F (36.4 C), temperature source Oral, resp. rate 16, height 6' (1.829 m), weight 67.1 kg, SpO2 97 %.Body mass index is 20.07 kg/m.  General Appearance: Casual  Eye Contact::  Fair  Speech:  Normal Rate409  Volume:  Normal  Mood:  Irritable  Affect:  Congruent  Thought Process:  Coherent and Descriptions of Associations: Circumstantial  Orientation:  Full (Time, Place, and Person)  Thought Content:  Rumination  Suicidal Thoughts:  Yes.  without intent/plan  Homicidal Thoughts:  No  Memory:  Immediate;   Poor Recent;   Poor Remote;   Poor  Judgement:  Poor  Insight:  Lacking  Psychomotor Activity:  Normal  Concentration:  Fair  Recall:  [employer] of Knowledge:Fair  Language: Good  Akathisia:  Negative  Handed:  Right  AIMS (if indicated):     Assets:  Desire for Improvement Resilience  Sleep:  Number of Hours: 4.5  Cognition: WNL  ADL's:  Intact   Mental Status Per Nursing Assessment::   On Admission:  Suicidal ideation indicated by patient, Intention to act on suicide plan, Suicide plan, Belief that plan would result in death, Plan includes specific time, place, or method, Self-harm thoughts  Demographic Factors:  Male, Caucasian, Gay, lesbian, or bisexual orientation, Low socioeconomic status, Living alone and Unemployed  Loss Factors: Financial problems/change in socioeconomic  status  Historical Factors: Impulsivity  Risk Reduction Factors:   NA  Continued Clinical Symptoms:  Depression:   Impulsivity Personality Disorders:   Cluster B  Cognitive Features That Contribute To Risk:  Thought constriction (tunnel vision)    Suicide Risk:  Minimal: No identifiable suicidal ideation.  Patients presenting with no risk factors but with morbid ruminations; may be classified as minimal risk based on the severity of the depressive symptoms  Follow-up Information    Patient declines Follow up.   Contact information: Patient declines all outpatient follow up.          Plan Of Care/Follow-up recommendations:  Activity:  ad lib  Sharma Covert, MD 12/06/2018, 11:11 AM

## 2018-12-06 NOTE — Progress Notes (Signed)
D:  Patient denied SI and HI, contracts for safety.  Denied A/V hallucinations.  Denied pain A:  Medications administered per MD orders. Emotional support and encouragement given patient. R:  Safety maintained with 15 minute checks. Patient looking forward to discharge today.        hi
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
## 2018-12-06 NOTE — Progress Notes (Signed)
Discharge Note:  Patient left [facility] with lift ride.  Patient denied SI and HI.  Denied A/V hallucinations.  Suicide prevention information given and discussed with patient who stated he understood and had no questions.  Patient stated he received all his belongings, clothing, etc.  Patient stated he appreciated all assistance received from [facility] staff.  All required discharge information given to patient at discharge.

## 2020-06-26 IMAGING — DX DG MANDIBLE 4+V
5 series · 5 of 5 positions shown · non-contrast
Comparison: CT dated May 26, 2015

CLINICAL DATA: Right-sided jaw pain into maxilla.

EXAM:
MANDIBLE - 4+ VIEW

[mandible pa (1 of 4)]
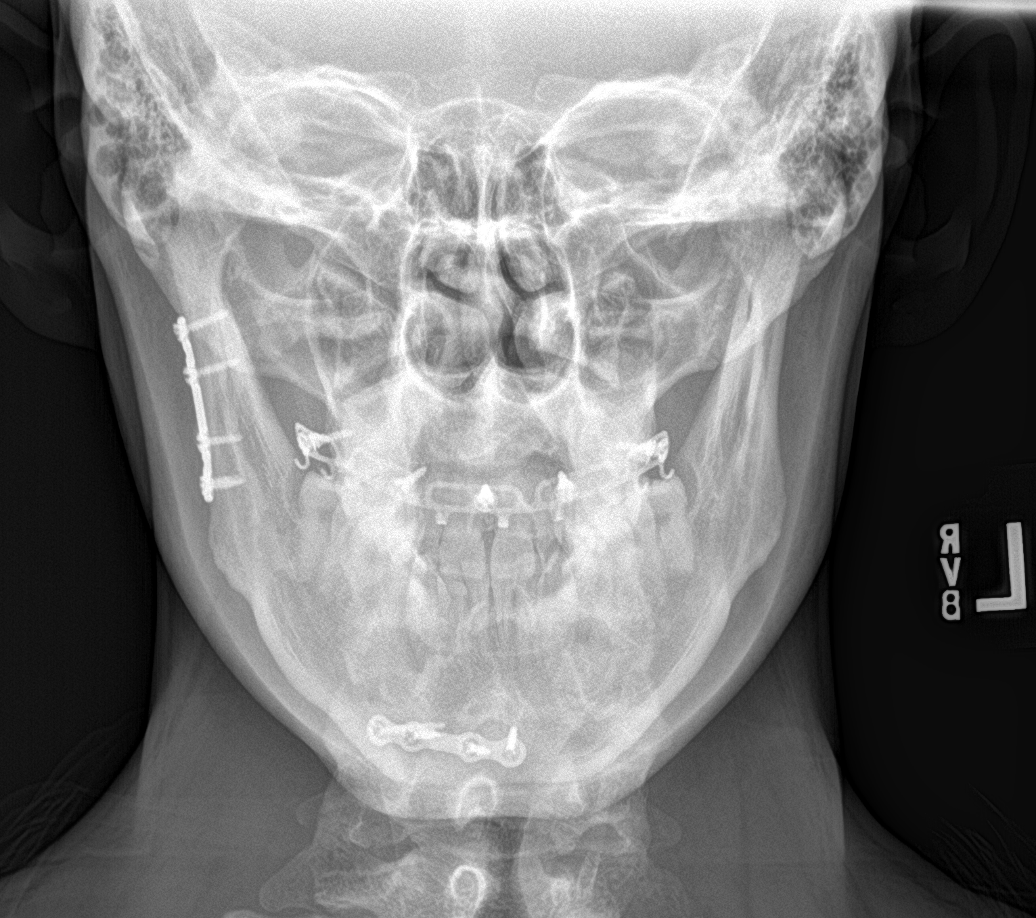

[mandible townes]
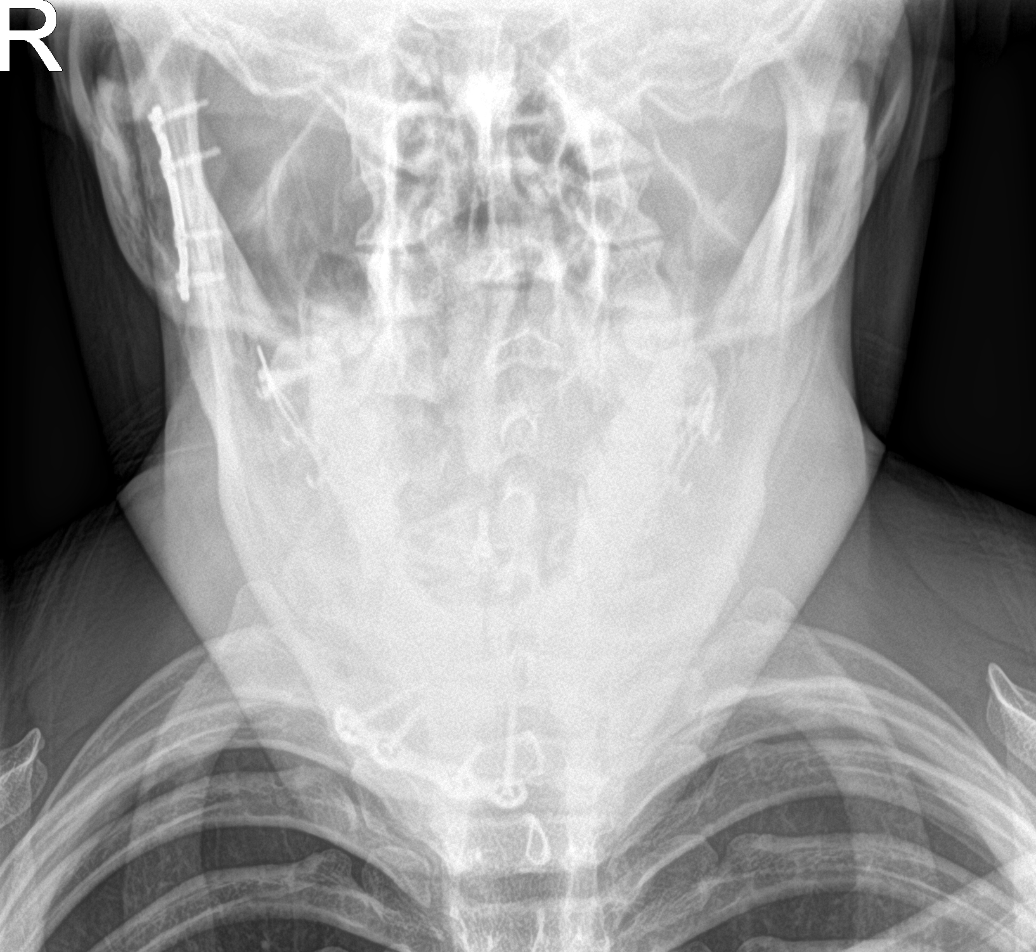

[mandible pa (2 of 4)]
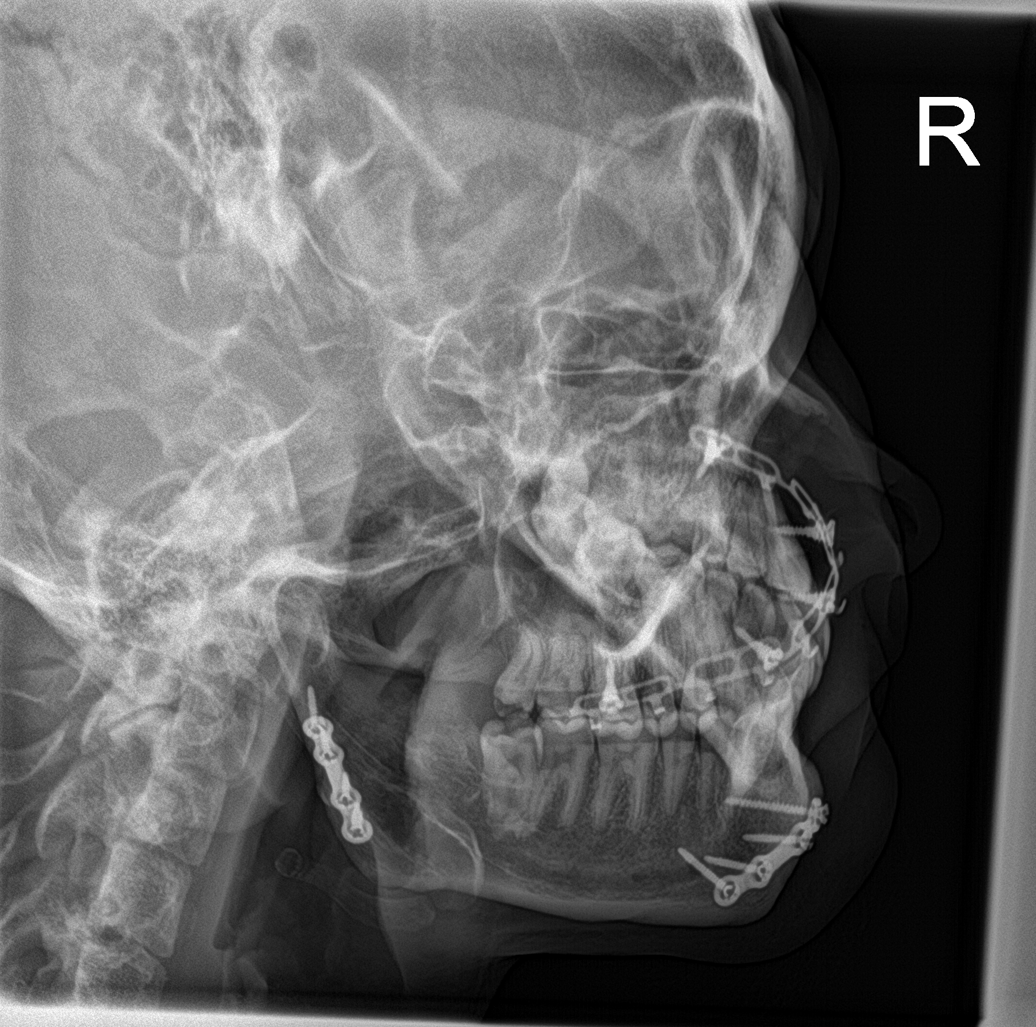

[mandible pa (3 of 4)]
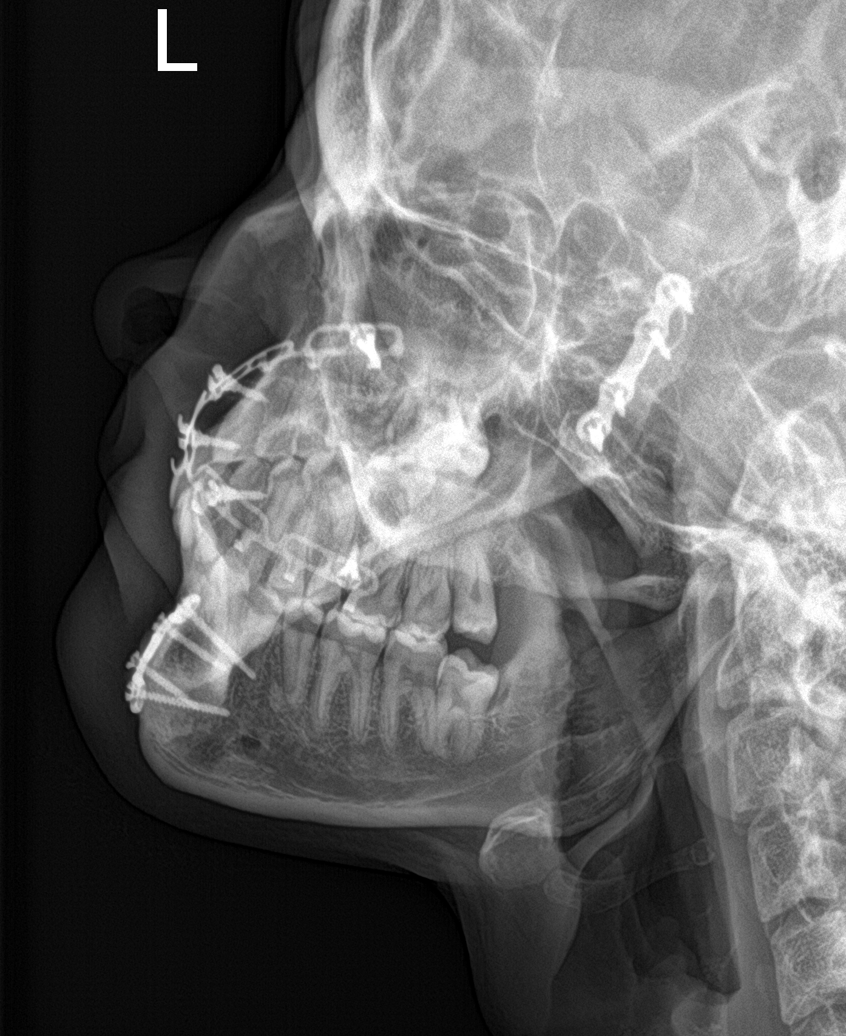

[mandible pa (4 of 4)]
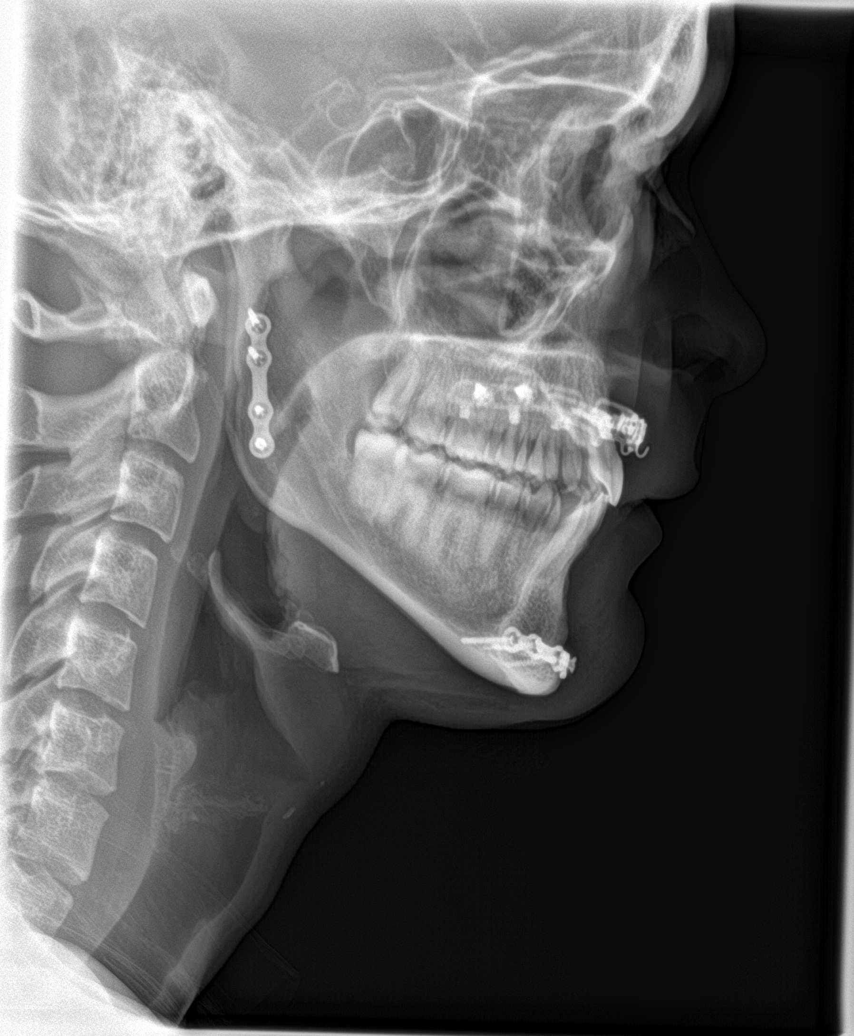

[5 of 5 positions shown; findings below may reference images not displayed]

FINDINGS: Patient is status post prior plate screw fixation of the mandible
and maxilla. The hardware appears grossly intact. There is no
prevertebral soft tissue swelling involving the visualized portions
of the cervical spine.
IMPRESSION: No acute osseous abnormality.
